# Patient Record
Sex: Male | Born: 1960 | Race: White | Hispanic: No | Marital: Married | Smoking: Former smoker
Health system: Southern US, Community
[De-identification: ages and names within clinical notes are randomized; demographics above are authoritative.]

## PROBLEM LIST (undated history)

## (undated) DIAGNOSIS — M869 Osteomyelitis, unspecified: Secondary | ICD-10-CM

## (undated) HISTORY — PX: TOE DEBRIDEMENT: SHX1069

## (undated) HISTORY — DX: Osteomyelitis, unspecified: M86.9

## (undated) HISTORY — PX: COLONOSCOPY: SHX174

---

## 2019-04-12 ENCOUNTER — Other Ambulatory Visit: Payer: Self-pay | Admitting: Otolaryngology

## 2019-04-12 DIAGNOSIS — J349 Unspecified disorder of nose and nasal sinuses: Secondary | ICD-10-CM

## 2019-04-14 ENCOUNTER — Other Ambulatory Visit: Payer: Self-pay | Admitting: Otolaryngology

## 2019-04-17 ENCOUNTER — Other Ambulatory Visit: Payer: Self-pay | Admitting: Otolaryngology

## 2019-04-18 ENCOUNTER — Other Ambulatory Visit: Payer: Self-pay

## 2019-04-27 ENCOUNTER — Ambulatory Visit
Admission: RE | Admit: 2019-04-27 | Discharge: 2019-04-27 | Disposition: A | Discharge status: Home / Self Care | Payer: BLUE CROSS/BLUE SHIELD | Source: Ambulatory Visit | Attending: Otolaryngology | Admitting: Otolaryngology

## 2019-04-27 DIAGNOSIS — J349 Unspecified disorder of nose and nasal sinuses: Secondary | ICD-10-CM

## 2019-05-09 ENCOUNTER — Telehealth: Payer: Self-pay | Admitting: *Deleted

## 2019-05-09 NOTE — Telephone Encounter (Signed)
CT ordered, completed 5/21. Per Dr Jacinto Reap, had a conversation with Dr Johnnye Sima and patient was supposed to be referred to [facility] for chronic nasal bone infection. Justin Nelson wife [phone], [phone]

## 2019-05-10 ENCOUNTER — Other Ambulatory Visit: Payer: Self-pay

## 2019-05-10 ENCOUNTER — Ambulatory Visit
Admission: RE | Admit: 2019-05-10 | Discharge: 2019-05-10 | Disposition: A | Discharge status: Home / Self Care | Payer: BC Managed Care – PPO | Source: Ambulatory Visit | Attending: Infectious Disease | Admitting: Infectious Disease

## 2019-05-10 ENCOUNTER — Ambulatory Visit: Payer: BC Managed Care – PPO | Admitting: Infectious Disease

## 2019-05-10 ENCOUNTER — Encounter: Payer: Self-pay | Admitting: Infectious Disease

## 2019-05-10 VITALS — BP 147/84 | HR 77 | Temp 97.9°F | Wt 168.0 lb

## 2019-05-10 DIAGNOSIS — M8668 Other chronic osteomyelitis, other site: Secondary | ICD-10-CM

## 2019-05-10 DIAGNOSIS — J320 Chronic maxillary sinusitis: Secondary | ICD-10-CM | POA: Diagnosis not present

## 2019-05-10 DIAGNOSIS — J329 Chronic sinusitis, unspecified: Secondary | ICD-10-CM

## 2019-05-10 DIAGNOSIS — Z87891 Personal history of nicotine dependence: Secondary | ICD-10-CM | POA: Diagnosis not present

## 2019-05-10 DIAGNOSIS — M869 Osteomyelitis, unspecified: Secondary | ICD-10-CM

## 2019-05-10 HISTORY — DX: Chronic sinusitis, unspecified: J32.9

## 2019-05-10 HISTORY — DX: Personal history of nicotine dependence: Z87.891

## 2019-05-10 HISTORY — DX: Other chronic osteomyelitis, other site: M86.68

## 2019-05-10 NOTE — Telephone Encounter (Signed)
I believe Dr Tommy Medal saw him this AM thanks

## 2019-05-10 NOTE — Progress Notes (Signed)
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 Lucia Gaskins   Patient ID: Justin Nelson, male    DOB: 1961-10-17, 58 y.o.   MRN: [id number]  HPI   Justin Nelson is a 58 year old Caucasian male who is largely been healthy.  He did smoke for most of his life until he quit this last spring.  He also did have an episode of osteomyelitis of 1 of his digits of his toe more than 20 years ago which was treated with IV antibiotics.  He says that he has typically sinus infections that do not require antibiotics and if he states to me that he has only taken antibiotics within the last year otherwise not had antibiotics much since he had that episode of osteomyelitis 20 years ago.  Apparently in August 2019 he was suffering from nosebleeds and had a CT of his maxillofacial sinuses done at that time.  Then this February he developed influenza which was proven positive by a swab.  During that time.  He was irrigating his sinuses with distilled water and salt compounds which he has done previously to help treat sinus irritation and infection.  In the weeks that followed he developed a swelling on the bridge of his nose that is progressed since then.  He has been seen by Dr. Lucia Gaskins with ear nose and throat.  Initially there was thought that the patient had a nasal fracture and a deviated septum but there was never any history of trauma whatsoever.  In the ensuing months it is continued to grow.  He has been given antibiotics in the form of azithromycin then amoxicillin and more recently cefdinir along with a steroid pack  He had a CT scan performed which now suggests nasal bone osteomyelitis.  The radiologist were suggesting the possibility of an atypical organism such as a fungal organism or nontuberculous or tuberculosis mycobacterium.  In talking the patient he has lived his entire life in [place] other than a  short period of time in [place].  He is only travel to [place] once when he visited [place] and [place] in [place] which was roughly 8 years ago.  Other than that he has no foreign travel.  He has no known history of exposure to anyone with tuberculosis.  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  He has a [place].  He does not have any other pets and has had no exposure to plant animal farm animals animals being birth.  He does no history of hunting animals.  He has no history of being on steroids other than the recent prednisone taper.  He does not recall trauma to his nose ever.  He was referred for [place] for further evaluation and treatment.  I think clearly he needs a biopsy of the affected area significantly sent for bacterial cultures, AFB and fungal cultures.  I do think an organism such as nocardia could be in play here given the smoldering nature of this infection.  It would typically grow in the AFB media but could also be placed on buffered charcoal yeast extract for culture   Past Medical History:  Diagnosis Date  . 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 Chronic sinusitis 05/10/2019  . Former smoker 05/10/2019  . Osteomyelitis of toe ([place])     History reviewed. No pertinent surgical history.  History reviewed. No pertinent family history.    Social History   Socioeconomic History  . Marital status: Married    Spouse name: Not on file  . Number of children: Not on file  . Years of education: Not on file  . Highest education level: Not on file  Occupational History  . Not on file  Social Needs  . Financial resource strain: Not on file  . Food insecurity:    Worry: Not on file    Inability: Not on file  . Transportation needs:    Medical: Not on file     Non-medical: Not on file  Tobacco Use  . Smoking status: Former Research scientist (life sciences)  . Smokeless tobacco: Current User    Types: Snuff  Substance and Sexual Activity  . Alcohol use: Not on file  . Drug use: Not on file  . Sexual activity: Not on file  Lifestyle  . Physical activity:    Days per week: Not on file    Minutes per session: Not on file  . Stress: Not on file  Relationships  . Social connections:    Talks on phone: Not on file    Gets together: Not on file    Attends religious service: Not on file    Active member of club or organization: Not on file    Attends meetings of clubs or organizations: Not on file    Relationship status: Not on file  Other Topics Concern  . Not on file  Social History Narrative  . Not on file    Allergies  Allergen Reactions  . Codeine     Sweaty, pass out felling     Current Outpatient Medications:  .  cefUROXime (CEFTIN) 250 MG tablet, , Disp: , Rfl:  .  azithromycin (ZITHROMAX) 250 MG tablet, TAKE 2 TABLETS BY MOUTH TODAY, THEN TAKE 1 TABLET DAILY FOR 4 DAYS, Disp: , Rfl:  .  brompheniramine-pseudoephedrine-DM 30-2-10 MG/5ML syrup, TAKE 10ML PO TID PRF COUGH AND CONGESTION, Disp: , Rfl:  .  oseltamivir (TAMIFLU) 75 MG capsule, Take 75 mg by mouth 2 (two) times daily., Disp: , Rfl:  .  predniSONE (STERAPRED UNI-PAK 21 TAB) 10 MG (21) TBPK tablet, , Disp: , Rfl:  .  predniSONE (STERAPRED UNI-PAK 21 TAB) 10 MG (21) TBPK tablet, See admin instructions., Disp: , Rfl:  .  silver sulfADIAZINE (SILVADENE) 1 % cream, APPLY ONCE DAILY FOR 15 DAYS, Disp: , Rfl:     Review of Systems  Constitutional: Negative for chills and fever.  HENT: Positive for nosebleeds and sinus pain. Negative for congestion and sore throat.   Eyes: Negative for photophobia.  Respiratory: Negative for cough, shortness of breath and wheezing.   Cardiovascular: Negative for chest pain, palpitations and leg swelling.  Gastrointestinal: Negative for abdominal pain, blood in  stool, constipation, diarrhea, nausea and vomiting.  Genitourinary: Negative for dysuria, flank pain and hematuria.  Musculoskeletal: Negative for back pain and myalgias.  Skin: Negative for rash.  Neurological: Negative for dizziness, weakness and headaches.  Hematological: Does not bruise/bleed easily.  Psychiatric/Behavioral: Negative for suicidal ideas.       Objective:   Physical Exam Constitutional:      General: He is not in acute distress.    Appearance: Normal appearance. He is well-developed. He is not ill-appearing or diaphoretic.  HENT:     Head: Normocephalic and atraumatic.      Right Ear: Hearing and external ear normal.     Left Ear: Hearing and external ear normal.     Nose: No nasal deformity or rhinorrhea.  Eyes:     General: No scleral icterus.    Extraocular Movements: Extraocular movements intact.     Conjunctiva/sclera: Conjunctivae normal.     Right eye: Right conjunctiva is not injected.     Left eye: Left conjunctiva is not injected.  Neck:     Musculoskeletal: Normal range of motion and neck supple.     Vascular: No JVD.  Cardiovascular:     Rate and Rhythm: Normal rate and regular rhythm.     Heart sounds: Normal heart sounds, S1 normal and S2 normal. No murmur. No friction rub.  Pulmonary:     Effort: Pulmonary effort is normal. No respiratory distress.     Breath sounds: Normal breath sounds. No stridor. No rhonchi or rales.  Chest:     Chest wall: No tenderness.  Abdominal:     General: Bowel sounds are normal. There is no distension.     Palpations: Abdomen is soft.     Tenderness: There is no abdominal tenderness.  Musculoskeletal: Normal range of motion.     Right shoulder: Normal.     Left shoulder: Normal.     Right hip: Normal.     Left hip: Normal.     Right knee: Normal.     Left knee: Normal.  Lymphadenopathy:     Head:     Right side of head: No submandibular, preauricular or posterior auricular adenopathy.     Left side of  head: No submandibular, preauricular or posterior auricular adenopathy.     Cervical: No cervical adenopathy.     Right cervical: No superficial or deep cervical adenopathy.    Left cervical: No superficial or deep cervical adenopathy.  Skin:    General: Skin is warm and dry.     Coloration: Skin is not pale.     Findings: No abrasion, bruising, ecchymosis, erythema, lesion or rash.     Nails: There is no clubbing.   Neurological:     General: No focal deficit present.     Mental Status: He is alert and oriented to person, place, and time.     Sensory: No sensory deficit.     Coordination: Coordination normal.     Gait: Gait normal.  Psychiatric:        Attention and Perception: He is attentive.        Mood and Affect: Mood normal.        Speech: Speech normal.        Behavior: Behavior normal. Behavior is cooperative.        Thought Content: Thought content normal.        Judgment: Judgment normal.    May 10, 2019: Nose is swollen at the bridge           Assessment & Plan:  Nasal sinus osteomyelitis:  As above I would like him to undergo biopsy of this area so that a sample of the bone can be sent for bacterial cultures AFB cultures fungal cultures and cultures for nocardia.  Can also try to send a sample for PCR testing smears to wash in [place].  If we have a specific organism then I will offer him targeted to systemic IV antibiotics for 6 to 8 weeks.  In the  interim I will get a CMP CBC sed rate C-reactive protein.  I will check him for HIV and hepatitis C.  I will check a QuantiFERON gold I am skeptical that he would have tuberculosis and I will check a chest x-ray I also will check an angiotensin-converting enzyme as well as S and ANCA  I put in a call to Dr. Pollie Friar office and left my cell phone number with him so I could talk to him about how we could arrange biopsy and review the case  I spent greater than 40 minutes with the patient including greater than 50%  of time in face to face counsel of the patient regarding the work-up of his possible nasal osteomyelitis however need to get a biopsy and how to pursue targeted IV antibiotics if possible and in coordination of his care.

## 2019-05-12 ENCOUNTER — Ambulatory Visit: Payer: Self-pay | Admitting: Otolaryngology

## 2019-05-12 LAB — CBC WITH DIFFERENTIAL/PLATELET
Absolute Monocytes: 635 cells/uL (ref 200–950)
Basophils Absolute: 62 cells/uL (ref 0–200)
Basophils Relative: 0.9 %
Eosinophils Absolute: 131 cells/uL (ref 15–500)
Eosinophils Relative: 1.9 %
HCT: 44.7 % (ref 38.5–50.0)
Hemoglobin: 15.1 g/dL (ref 13.2–17.1)
Lymphs Abs: 2118 cells/uL (ref 850–3900)
MCH: 32.1 pg (ref 27.0–33.0)
MCHC: 33.8 g/dL (ref 32.0–36.0)
MCV: 95.1 fL (ref 80.0–100.0)
MPV: 11.9 fL (ref 7.5–12.5)
Monocytes Relative: 9.2 %
Neutro Abs: 3954 cells/uL (ref 1500–7800)
Neutrophils Relative %: 57.3 %
Platelets: 216 10*3/uL (ref 140–400)
RBC: 4.7 10*6/uL (ref 4.20–5.80)
RDW: 11.9 % (ref 11.0–15.0)
Total Lymphocyte: 30.7 %
WBC: 6.9 10*3/uL (ref 3.8–10.8)

## 2019-05-12 LAB — ANGIOTENSIN CONVERTING ENZYME: Angiotensin-Converting Enzyme: 52 U/L (ref 9–67)

## 2019-05-12 LAB — QUANTIFERON-TB GOLD PLUS
Mitogen-NIL: >10 IU/mL
NIL: 0.01 IU/mL
QuantiFERON-TB Gold Plus: NEGATIVE
TB1-NIL: 0 IU/mL
TB2-NIL: 0 IU/mL

## 2019-05-12 LAB — ANCA SCREEN W REFLEX TITER: ANCA Screen: NEGATIVE

## 2019-05-12 LAB — COMPLETE METABOLIC PANEL WITH GFR
AG Ratio: 1.6 (calc) (ref 1.0–2.5)
ALT: 20 U/L (ref 9–46)
AST: 19 U/L (ref 10–35)
Albumin: 4.6 g/dL (ref 3.6–5.1)
Alkaline phosphatase (APISO): 37 U/L (ref 35–144)
BUN: 16 mg/dL (ref 7–25)
CO2: 30 mmol/L (ref 20–32)
Calcium: 10.1 mg/dL (ref 8.6–10.3)
Chloride: 101 mmol/L (ref 98–110)
Creat: 1.04 mg/dL (ref 0.70–1.33)
GFR, Est African American: 93 mL/min/{1.73_m2} (ref >=60)
GFR, Est Non African American: 80 mL/min/{1.73_m2} (ref >=60)
Globulin: 2.8 g/dL (calc) (ref 1.9–3.7)
Glucose, Bld: 105 mg/dL — ABNORMAL HIGH (ref 65–99)
Potassium: 4.8 mmol/L (ref 3.5–5.3)
Sodium: 138 mmol/L (ref 135–146)
Total Bilirubin: 1.1 mg/dL (ref 0.2–1.2)
Total Protein: 7.4 g/dL (ref 6.1–8.1)

## 2019-05-12 LAB — C-REACTIVE PROTEIN: CRP: 0.8 mg/L (ref <8.0)

## 2019-05-12 LAB — SEDIMENTATION RATE: Sed Rate: 2 mm/h (ref 0–20)

## 2019-05-12 LAB — CRYPTOCOCCAL AG, LTX SCR RFLX TITER
Cryptococcal Ag Screen: NOT DETECTED
MICRO NUMBER:: 535855
SPECIMEN QUALITY:: ADEQUATE

## 2019-05-12 LAB — HIV ANTIBODY (ROUTINE TESTING W REFLEX): HIV 1&2 Ab, 4th Generation: NONREACTIVE

## 2019-05-12 LAB — HEPATITIS C ANTIBODY
Hepatitis C Ab: NONREACTIVE
SIGNAL TO CUT-OFF: 0.01 (ref <1.00)

## 2019-05-12 NOTE — H&P (Signed)
PREOPERATIVE H&P  Chief Complaint: chronic nasal infection with osteomyelitis of the nasal bones and septum  HPI: Justin Nelson is a 58 y.o. male who presents for evaluation of chronic nasal infection. This initially began back in February where he developed tenderness over the nasal bones and swelling. He recently had a CT scan of the sinuses that did not demonstrate any significant sinus disease but demonstrate changes of the nasal bone and septum consistent with osteomyelitis. He also has some thickening of the tissue over the left nasal bone. He is taken to the operating room this time for biopsy and cultures.  Past Medical History:  Diagnosis Date  . 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 Chronic sinusitis 05/10/2019  . Former smoker 05/10/2019  . Osteomyelitis of toe ([place])    No past surgical history on file. Social History   Socioeconomic History  . Marital status: Married    Spouse name: Not on file  . Number of children: Not on file  . Years of education: Not on file  . Highest education level: Not on file  Occupational History  . Not on file  Social Needs  . Financial resource strain: Not on file  . Food insecurity:    Worry: Not on file    Inability: Not on file  . Transportation needs:    Medical: Not on file    Non-medical: Not on file  Tobacco Use  . Smoking status: Former Research scientist (life sciences)  . Smokeless tobacco: Current User    Types: Snuff  Substance and Sexual Activity  . Alcohol use: Not on file  . Drug use: Not on file  . Sexual activity: Not on file  Lifestyle  . Physical activity:    Days per week: Not on file    Minutes per session: Not on file  . Stress: Not on file  Relationships  . Social connections:    Talks on phone: Not on file    Gets together: Not on file    Attends religious service: Not on file    Active member of club or organization: Not on file    Attends meetings of clubs or organizations: Not on file    Relationship  status: Not on file  Other Topics Concern  . Not on file  Social History Narrative  . Not on file   No family history on file. Allergies  Allergen Reactions  . Codeine     Sweaty, pass out felling   Prior to Admission medications   Medication Sig Start Date End Date Taking? Authorizing Provider  azithromycin (ZITHROMAX) 250 MG tablet TAKE 2 TABLETS BY MOUTH TODAY, THEN TAKE 1 TABLET DAILY FOR 4 DAYS 01/21/19   [provider]  brompheniramine-pseudoephedrine-DM 30-2-10 MG/5ML syrup TAKE 10ML PO TID PRF COUGH AND CONGESTION 01/24/19   [provider]  cefUROXime (CEFTIN) 250 MG tablet  05/02/19   [provider]  oseltamivir (TAMIFLU) 75 MG capsule Take 75 mg by mouth 2 (two) times daily. 01/21/19   [provider]  predniSONE (STERAPRED UNI-PAK 21 TAB) 10 MG (21) TBPK tablet  05/02/19   [provider]  predniSONE (STERAPRED UNI-PAK 21 TAB) 10 MG (21) TBPK tablet See admin instructions. 05/02/19   [provider]  silver sulfADIAZINE (SILVADENE) 1 % cream APPLY ONCE DAILY FOR 15 DAYS 01/13/19   [provider]     Positive ROS: chronic tenderness of the nasal bone  All other systems have been reviewed and were otherwise negative  with the exception of those mentioned in the HPI and as above.  Physical Exam: There were no vitals filed for this visit.  General: Alert, no acute distress Oral: Normal oral mucosa and tonsils Nasal: Clear nasal passages. No purulence no foreign body noted. He has swelling and erythema more on the left side the nasal bone. Neck: No palpable adenopathy or thyroid nodules Ear: Ear canal is clear with normal appearing TMs Cardiovascular: Regular rate and rhythm, no murmur.  Respiratory: Clear to auscultation Neurologic: Alert and oriented x 3   Assessment/Plan: CHRONIC RHINITIS AND OSTEOMYELITIS Plan for Procedure(s): NASAL ENDOSCOPY AND BIOPSY   Melony Overly, MD 05/12/2019 4:34 PM

## 2019-05-15 ENCOUNTER — Encounter (HOSPITAL_BASED_OUTPATIENT_CLINIC_OR_DEPARTMENT_OTHER): Payer: Self-pay | Admitting: *Deleted

## 2019-05-15 ENCOUNTER — Other Ambulatory Visit: Payer: Self-pay

## 2019-05-15 LAB — HISTOPLASMA ANTIGEN, URINE: Histoplasma Antigen, urine: <0.2

## 2019-05-16 ENCOUNTER — Other Ambulatory Visit (HOSPITAL_COMMUNITY)
Admission: RE | Admit: 2019-05-16 | Discharge: 2019-05-16 | Disposition: A | Discharge status: Home / Self Care | Payer: BC Managed Care – PPO | Source: Ambulatory Visit | Attending: Otolaryngology | Admitting: Otolaryngology

## 2019-05-16 DIAGNOSIS — Z1159 Encounter for screening for other viral diseases: Secondary | ICD-10-CM | POA: Insufficient documentation

## 2019-05-16 DIAGNOSIS — Z01812 Encounter for preprocedural laboratory examination: Secondary | ICD-10-CM | POA: Insufficient documentation

## 2019-05-17 LAB — NOVEL CORONAVIRUS, NAA (HOSP ORDER, SEND-OUT TO REF LAB; TAT 18-24 HRS): SARS-CoV-2, NAA: NOT DETECTED

## 2019-05-19 ENCOUNTER — Ambulatory Visit (HOSPITAL_BASED_OUTPATIENT_CLINIC_OR_DEPARTMENT_OTHER)
Admission: RE | Admit: 2019-05-19 | Discharge: 2019-05-19 | Disposition: A | Discharge status: Home / Self Care | Payer: BC Managed Care – PPO | Attending: Otolaryngology | Admitting: Otolaryngology

## 2019-05-19 ENCOUNTER — Other Ambulatory Visit: Payer: Self-pay

## 2019-05-19 ENCOUNTER — Encounter (HOSPITAL_BASED_OUTPATIENT_CLINIC_OR_DEPARTMENT_OTHER)
Admission: RE | Disposition: A | Discharge status: Home / Self Care | Payer: Self-pay | Source: Home / Self Care | Attending: Otolaryngology

## 2019-05-19 ENCOUNTER — Ambulatory Visit (HOSPITAL_BASED_OUTPATIENT_CLINIC_OR_DEPARTMENT_OTHER): Payer: BC Managed Care – PPO | Admitting: Anesthesiology

## 2019-05-19 ENCOUNTER — Encounter (HOSPITAL_BASED_OUTPATIENT_CLINIC_OR_DEPARTMENT_OTHER): Payer: Self-pay | Admitting: *Deleted

## 2019-05-19 DIAGNOSIS — Z885 Allergy status to narcotic agent status: Secondary | ICD-10-CM | POA: Diagnosis not present

## 2019-05-19 DIAGNOSIS — M869 Osteomyelitis, unspecified: Secondary | ICD-10-CM | POA: Insufficient documentation

## 2019-05-19 DIAGNOSIS — J310 Chronic rhinitis: Secondary | ICD-10-CM | POA: Diagnosis present

## 2019-05-19 DIAGNOSIS — C410 Malignant neoplasm of bones of skull and face: Secondary | ICD-10-CM | POA: Diagnosis not present

## 2019-05-19 DIAGNOSIS — Z87891 Personal history of nicotine dependence: Secondary | ICD-10-CM | POA: Diagnosis not present

## 2019-05-19 HISTORY — PX: NASAL ENDOSCOPY: SHX6577

## 2019-05-19 SURGERY — ENDOSCOPY, NOSE
Anesthesia: General | Site: Nose

## 2019-05-19 MED ORDER — PHENYLEPHRINE 40 MCG/ML (10ML) SYRINGE FOR IV PUSH (FOR BLOOD PRESSURE SUPPORT)
PREFILLED_SYRINGE | INTRAVENOUS | Status: AC
  Filled 2019-05-19: qty 10

## 2019-05-19 MED ORDER — ONDANSETRON HCL 4 MG/2ML IJ SOLN
INTRAMUSCULAR | Status: AC
  Filled 2019-05-19: qty 2

## 2019-05-19 MED ORDER — CHLORHEXIDINE GLUCONATE CLOTH 2 % EX PADS: 6.0000 | MEDICATED_PAD | Freq: Once | CUTANEOUS | Status: DC

## 2019-05-19 MED ORDER — DEXAMETHASONE SODIUM PHOSPHATE 10 MG/ML IJ SOLN
INTRAMUSCULAR | Status: AC
  Filled 2019-05-19: qty 1

## 2019-05-19 MED ORDER — MEPERIDINE HCL 25 MG/ML IJ SOLN: 6.2500 mg | INTRAMUSCULAR | Status: DC | PRN

## 2019-05-19 MED ORDER — SUGAMMADEX SODIUM 500 MG/5ML IV SOLN
INTRAVENOUS | Status: AC
  Filled 2019-05-19: qty 5

## 2019-05-19 MED ORDER — SUGAMMADEX SODIUM 200 MG/2ML IV SOLN
INTRAVENOUS | Status: DC | PRN
  Administered 2019-05-19: 200 mg via INTRAVENOUS

## 2019-05-19 MED ORDER — PROPOFOL 10 MG/ML IV BOLUS
INTRAVENOUS | Status: DC | PRN
  Administered 2019-05-19: 150 mg via INTRAVENOUS

## 2019-05-19 MED ORDER — LIDOCAINE-EPINEPHRINE 1 %-1:100000 IJ SOLN
INTRAMUSCULAR | Status: AC
  Filled 2019-05-19: qty 1

## 2019-05-19 MED ORDER — MUPIROCIN 2 % EX OINT
TOPICAL_OINTMENT | CUTANEOUS | Status: AC
  Filled 2019-05-19: qty 22

## 2019-05-19 MED ORDER — FENTANYL CITRATE (PF) 100 MCG/2ML IJ SOLN
INTRAMUSCULAR | Status: AC
  Filled 2019-05-19: qty 2

## 2019-05-19 MED ORDER — LIDOCAINE 2% (20 MG/ML) 5 ML SYRINGE
INTRAMUSCULAR | Status: AC
  Filled 2019-05-19: qty 5

## 2019-05-19 MED ORDER — CEFUROXIME AXETIL 250 MG PO TABS: 250.0000 mg | ORAL_TABLET | Freq: Two times a day (BID) | ORAL | 0 refills | Status: AC

## 2019-05-19 MED ORDER — HYDROMORPHONE HCL 1 MG/ML IJ SOLN: 0.2500 mg | INTRAMUSCULAR | Status: DC | PRN

## 2019-05-19 MED ORDER — CEFAZOLIN SODIUM-DEXTROSE 2-4 GM/100ML-% IV SOLN
INTRAVENOUS | Status: AC
  Filled 2019-05-19: qty 100

## 2019-05-19 MED ORDER — LIDOCAINE HCL (CARDIAC) PF 100 MG/5ML IV SOSY
PREFILLED_SYRINGE | INTRAVENOUS | Status: DC | PRN
  Administered 2019-05-19: 50 mg via INTRAVENOUS

## 2019-05-19 MED ORDER — ACETAMINOPHEN 500 MG PO TABS
ORAL_TABLET | ORAL | Status: AC
  Filled 2019-05-19: qty 2

## 2019-05-19 MED ORDER — MIDAZOLAM HCL 2 MG/2ML IJ SOLN: 0.5000 mg | Freq: Once | INTRAMUSCULAR | Status: DC | PRN

## 2019-05-19 MED ORDER — CEFAZOLIN SODIUM-DEXTROSE 2-3 GM-%(50ML) IV SOLR
INTRAVENOUS | Status: DC | PRN
  Administered 2019-05-19: 2 g via INTRAVENOUS

## 2019-05-19 MED ORDER — OXYMETAZOLINE HCL 0.05 % NA SOLN
NASAL | Status: AC
  Filled 2019-05-19: qty 30

## 2019-05-19 MED ORDER — SCOPOLAMINE 1 MG/3DAYS TD PT72: 1.0000 | MEDICATED_PATCH | Freq: Once | TRANSDERMAL | Status: DC | PRN

## 2019-05-19 MED ORDER — BACITRACIN ZINC 500 UNIT/GM EX OINT
TOPICAL_OINTMENT | CUTANEOUS | Status: AC
  Filled 2019-05-19: qty 28.35

## 2019-05-19 MED ORDER — FENTANYL CITRATE (PF) 100 MCG/2ML IJ SOLN
50.0000 ug | INTRAMUSCULAR | Status: AC | PRN
  Administered 2019-05-19 (×2): 50 ug via INTRAVENOUS
  Administered 2019-05-19: 11:00:00 100 ug via INTRAVENOUS

## 2019-05-19 MED ORDER — DEXAMETHASONE SODIUM PHOSPHATE 4 MG/ML IJ SOLN
INTRAMUSCULAR | Status: DC | PRN
  Administered 2019-05-19: 10 mg via INTRAVENOUS

## 2019-05-19 MED ORDER — OXYMETAZOLINE HCL 0.05 % NA SOLN
NASAL | Status: DC | PRN
  Administered 2019-05-19: 1 via TOPICAL

## 2019-05-19 MED ORDER — MIDAZOLAM HCL 2 MG/2ML IJ SOLN
INTRAMUSCULAR | Status: AC
  Filled 2019-05-19: qty 2

## 2019-05-19 MED ORDER — LIDOCAINE-EPINEPHRINE 1 %-1:100000 IJ SOLN
INTRAMUSCULAR | Status: DC | PRN
  Administered 2019-05-19: 2 mL

## 2019-05-19 MED ORDER — EPHEDRINE 5 MG/ML INJ
INTRAVENOUS | Status: AC
  Filled 2019-05-19: qty 10

## 2019-05-19 MED ORDER — ACETAMINOPHEN 500 MG PO TABS
1000.0000 mg | ORAL_TABLET | Freq: Once | ORAL | Status: AC
  Administered 2019-05-19: 1000 mg via ORAL

## 2019-05-19 MED ORDER — ROCURONIUM BROMIDE 100 MG/10ML IV SOLN
INTRAVENOUS | Status: DC | PRN
  Administered 2019-05-19: 50 mg via INTRAVENOUS

## 2019-05-19 MED ORDER — PROMETHAZINE HCL 25 MG/ML IJ SOLN: 6.2500 mg | INTRAMUSCULAR | Status: DC | PRN

## 2019-05-19 MED ORDER — LACTATED RINGERS IV SOLN
INTRAVENOUS | Status: DC
  Administered 2019-05-19 (×2): via INTRAVENOUS

## 2019-05-19 MED ORDER — MIDAZOLAM HCL 2 MG/2ML IJ SOLN
1.0000 mg | INTRAMUSCULAR | Status: DC | PRN
  Administered 2019-05-19: 2 mg via INTRAVENOUS

## 2019-05-19 MED ORDER — SUCCINYLCHOLINE CHLORIDE 200 MG/10ML IV SOSY
PREFILLED_SYRINGE | INTRAVENOUS | Status: AC
  Filled 2019-05-19: qty 10

## 2019-05-19 MED ORDER — ROCURONIUM BROMIDE 10 MG/ML (PF) SYRINGE
PREFILLED_SYRINGE | INTRAVENOUS | Status: AC
  Filled 2019-05-19: qty 10

## 2019-05-19 SURGICAL SUPPLY — 57 items
BLADE RAD40 ROTATE 4M 4 5PK (BLADE) IMPLANT
BLADE RAD40 ROTATE 4M 4MM 5PK (BLADE)
BLADE RAD60 ROTATE M4 4 5PK (BLADE) IMPLANT
BLADE RAD60 ROTATE M4 4MM 5PK (BLADE)
BLADE TRICUT ROTATE M4 4 5PK (BLADE) IMPLANT
BLADE TRICUT ROTATE M4 4MM 5PK (BLADE)
BUR HS RAD FRONTAL 3 (BURR) IMPLANT
BUR HS RAD FRONTAL 3MM (BURR)
CANISTER SUC SOCK COL 7IN (MISCELLANEOUS) IMPLANT
CANISTER SUCT 1200ML W/VALVE (MISCELLANEOUS) ×3 IMPLANT
COAGULATOR SUCT 8FR VV (MISCELLANEOUS) ×3 IMPLANT
CONT SPEC 4OZ CLIKSEAL STRL BL (MISCELLANEOUS) ×3 IMPLANT
COVER WAND RF STERILE (DRAPES) IMPLANT
DECANTER SPIKE VIAL GLASS SM (MISCELLANEOUS) IMPLANT
DEPRESSOR TONGUE BLADE STERILE (MISCELLANEOUS) ×3 IMPLANT
DRESSING NASAL KENNEDY 3.5X.9 (MISCELLANEOUS) IMPLANT
DRSG NASAL KENNEDY 3.5X.9 (MISCELLANEOUS)
DRSG NASAL KENNEDY LMNT 8CM (GAUZE/BANDAGES/DRESSINGS) IMPLANT
DRSG NASOPORE 8CM (GAUZE/BANDAGES/DRESSINGS) ×3 IMPLANT
DRSG TELFA 3X8 NADH (GAUZE/BANDAGES/DRESSINGS) IMPLANT
ELECT REM PT RETURN 9FT ADLT (ELECTROSURGICAL) ×3
ELECTRODE REM PT RTRN 9FT ADLT (ELECTROSURGICAL) ×1 IMPLANT
GLOVE BIO SURGEON STRL SZ 6.5 (GLOVE) ×2 IMPLANT
GLOVE BIO SURGEON STRL SZ7 (GLOVE) ×3 IMPLANT
GLOVE BIO SURGEONS STRL SZ 6.5 (GLOVE) ×1
GLOVE BIOGEL PI IND STRL 7.0 (GLOVE) ×2 IMPLANT
GLOVE BIOGEL PI IND STRL 7.5 (GLOVE) ×1 IMPLANT
GLOVE BIOGEL PI INDICATOR 7.0 (GLOVE) ×4
GLOVE BIOGEL PI INDICATOR 7.5 (GLOVE) ×2
GLOVE SS BIOGEL STRL SZ 7.5 (GLOVE) ×1 IMPLANT
GLOVE SUPERSENSE BIOGEL SZ 7.5 (GLOVE) ×2
GOWN STRL REUS W/ TWL LRG LVL3 (GOWN DISPOSABLE) IMPLANT
GOWN STRL REUS W/ TWL XL LVL3 (GOWN DISPOSABLE) ×3 IMPLANT
GOWN STRL REUS W/TWL LRG LVL3 (GOWN DISPOSABLE)
GOWN STRL REUS W/TWL XL LVL3 (GOWN DISPOSABLE) ×6
HEMOSTAT SURGICEL .5X2 ABSORB (HEMOSTASIS) IMPLANT
HEMOSTAT SURGICEL 2X14 (HEMOSTASIS) IMPLANT
IV NS 500ML (IV SOLUTION)
IV NS 500ML BAXH (IV SOLUTION) IMPLANT
NEEDLE PRECISIONGLIDE 27X1.5 (NEEDLE) ×3 IMPLANT
NEEDLE SPNL 25GX3.5 QUINCKE BL (NEEDLE) IMPLANT
NS IRRIG 1000ML POUR BTL (IV SOLUTION) ×3 IMPLANT
PACK BASIN DAY SURGERY FS (CUSTOM PROCEDURE TRAY) ×3 IMPLANT
PACK ENT DAY SURGERY (CUSTOM PROCEDURE TRAY) ×3 IMPLANT
PATTIES SURGICAL .5 X3 (DISPOSABLE) ×3 IMPLANT
SLEEVE SCD COMPRESS KNEE MED (MISCELLANEOUS) ×3 IMPLANT
SOLUTION ANTI FOG 6CC (MISCELLANEOUS) ×3 IMPLANT
SPONGE GAUZE 2X2 8PLY STER LF (GAUZE/BANDAGES/DRESSINGS) ×1
SPONGE GAUZE 2X2 8PLY STRL LF (GAUZE/BANDAGES/DRESSINGS) ×2 IMPLANT
SUT CHROMIC 4 0 PS 2 18 (SUTURE) IMPLANT
SUT SILK 2 0 PERMA HAND 18 BK (SUTURE) IMPLANT
SYR 3ML 18GX1 1/2 (SYRINGE) IMPLANT
TOWEL GREEN STERILE FF (TOWEL DISPOSABLE) ×6 IMPLANT
TRAY DSU PREP LF (CUSTOM PROCEDURE TRAY) ×3 IMPLANT
TUBE CONNECTING 20'X1/4 (TUBING)
TUBE CONNECTING 20X1/4 (TUBING) IMPLANT
YANKAUER SUCT BULB TIP NO VENT (SUCTIONS) ×3 IMPLANT

## 2019-05-19 NOTE — Op Note (Signed)
[name], Justin Nelson MEDICAL RECORD HM:[id number] ACCOUNT [id number] DATE OF BIRTH:November 13, 1961 FACILITY: [facility] LOCATION: [facility] PHYSICIAN:Zai Chmiel Lincoln Maxin, MD  OPERATIVE REPORT  DATE OF PROCEDURE:  05/19/2019  PREOPERATIVE DIAGNOSES:  Chronic nasal infection, questionable neoplasm.  POSTOPERATIVE DIAGNOSES:  Chronic nasal infection, questionable neoplasm.  OPERATION PERFORMED:  Nasal endoscopy with a biopsy from right nasal cavity.  SURGEON:  Melony Overly, MD  ANESTHESIA:  General endotracheal.  COMPLICATIONS:  None.  ESTIMATED BLOOD LOSS:  Minimal.  BRIEF CLINICAL NOTE:  The patient is a 58 year old gentleman who has had a chronic sore on the bridge of his nose that has gradually gotten larger over the last 3 or 4 months.  He had a CT scan that showed some changes with thinning of the nasal bones as  well as changes of the septal bone with very thickened tissue in the nasal cavity.  Sinuses were relatively clear.  He has had chronic soreness in this area for 2-3 months.  On clinical exam, this is questionable whether this represents a chronic  infection versus Wegener's granulomatosis versus tumor.  He is taken to the operating room at this time for endoscopic exam, biopsies and possible cultures.  DESCRIPTION OF PROCEDURE:  After adequate endotracheal anesthesia, the patient's nose was decongested with Afrin and then nasal endoscopy was performed.  He had a deviation of the septum to the left.  The left nasal mucosa was relatively clear with  minimal changes; however, on the right side, he had an ulcer on the mid superior portion of the septum with some granulation type appearing tissue around the edges of the ulcer.  A large piece of tissue was taken from the midportion of the right septum  and sent for frozen section.  While we were awaiting frozen section, several other biopsies were all obtained from the right nasal cavity including some cartilage and bone with some  surrounding tissue.  This was separated for pathology as well as for  possible cultures.  Hemostasis was obtained with Afrin packing and suction cautery.  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  Following this, the procedure was completed and the remaining tissue was all sent for pathology.   Hemostasis was obtained with suction cautery and a single Nasopore pack was placed in the right nasal cavity.  This completed the procedure.  The patient subsequently was awoken from anesthesia.  He received 2 g Ancef intraoperatively, and patient will  be discharged home later this morning.  He will follow up in my office in 1 week for recheck and review final pathology.  LN/NUANCE  D:05/19/2019 T:05/19/2019 JOB:[id number]

## 2019-05-19 NOTE — Discharge Instructions (Addendum)
Apply cool compress to nose to reduce bleeding and swelling Tylenol or ibuprofen prn pain ** You had 1000 mg of tylenol at 1230 Ceftin 500 mg bid for 1 week Return to see Dr Lucia Gaskins next Thursday at 4 pm   [place] Instructions  Activity: Get plenty of rest for the remainder of the day. A responsible individual must stay with you for 24 hours following the procedure.  For the next 24 hours, DO NOT: -Drive a car -Paediatric nurse -Drink alcoholic beverages -Take any medication unless instructed by your physician -Make any legal decisions or sign important papers.  Meals: Start with liquid foods such as gelatin or soup. Progress to regular foods as tolerated. Avoid greasy, spicy, heavy foods. If nausea and/or vomiting occur, drink only clear liquids until the nausea and/or vomiting subsides. Call your physician if vomiting continues.  Special Instructions/Symptoms: Your throat may feel dry or sore from the anesthesia or the breathing tube placed in your throat during surgery. If this causes discomfort, gargle with warm salt water. The discomfort should disappear within 24 hours.  If you had a scopolamine patch placed behind your ear for the management of post- operative nausea and/or vomiting:  1. The medication in the patch is effective for 72 hours, after which it should be removed.  Wrap patch in a tissue and discard in the trash. Wash hands thoroughly with soap and water. 2. You may remove the patch earlier than 72 hours if you experience unpleasant side effects which may include dry mouth, dizziness or visual disturbances. 3. Avoid touching the patch. Wash your hands with soap and water after contact with the patch.

## 2019-05-19 NOTE — Interval H&P Note (Signed)
History and Physical Interval Note:  05/19/2019 10:25 AM  Justin Nelson  has presented today for surgery, with the diagnosis of CHRONIC RHINITIS AND OSTEOMYELITIS.  The various methods of treatment have been discussed with the patient and family. After consideration of risks, benefits and other options for treatment, the patient has consented to  Procedure(s): NASAL ENDOSCOPY AND BIOPSY (N/A) as a surgical intervention.  The patient's history has been reviewed, patient examined, no change in status, stable for surgery.  I have reviewed the patient's chart and labs.  Questions were answered to the patient's satisfaction.     Melony Overly

## 2019-05-19 NOTE — Anesthesia Procedure Notes (Signed)
Procedure Name: Intubation Date/Time: 05/19/2019 10:37 AM Performed by: Willa Frater, CRNA Pre-anesthesia Checklist: Patient identified, Emergency Drugs available, Suction available and Patient being monitored Patient Re-evaluated:Patient Re-evaluated prior to induction Oxygen Delivery Method: Circle system utilized Preoxygenation: Pre-oxygenation with 100% oxygen Induction Type: IV induction Ventilation: Mask ventilation without difficulty Laryngoscope Size: Mac and 3 Grade View: Grade II Tube type: Oral Number of attempts: 1 Airway Equipment and Method: Stylet and Oral airway Placement Confirmation: ETT inserted through vocal cords under direct vision,  positive ETCO2 and breath sounds checked- equal and bilateral Secured at: 22 cm Tube secured with: Tape Dental Injury: Teeth and Oropharynx as per pre-operative assessment

## 2019-05-19 NOTE — Brief Op Note (Signed)
05/19/2019  11:49 AM  PATIENT:  Justin Nelson  58 y.o. male  PRE-OPERATIVE DIAGNOSIS:  CHRONIC RHINITIS AND OSTEOMYELITIS  POST-OPERATIVE DIAGNOSIS:  CHRONIC RHINITIS AND OSTEOMYELITIS  PROCEDURE:  Procedure(s): NASAL ENDOSCOPY AND BIOPSY (N/A)  SURGEON:  Surgeon(s) and Role:    Rozetta Nunnery, MD - Primary  PHYSICIAN ASSISTANT:   ASSISTANTS: none   ANESTHESIA:   general  EBL:  minimal  BLOOD ADMINISTERED:none  DRAINS: none   LOCAL MEDICATIONS USED:  NONE  SPECIMEN:  Source of Specimen:  right nasal cavity  DISPOSITION OF SPECIMEN:  PATHOLOGY  COUNTS:  YES  TOURNIQUET:  * No tourniquets in log *  DICTATION: .Other Dictation: Dictation Number [id number]  PLAN OF CARE: Discharge to home after PACU  PATIENT DISPOSITION:  PACU - hemodynamically stable.   Delay start of Pharmacological VTE agent (>24hrs) due to surgical blood loss or risk of bleeding: yes

## 2019-05-19 NOTE — Transfer of Care (Signed)
Immediate Anesthesia Transfer of Care Note  Patient: Lexicographer  Procedure(s) Performed: NASAL ENDOSCOPY AND BIOPSY (N/A Nose)  Patient Location: PACU  Anesthesia Type:General  Level of Consciousness: awake, alert  and oriented  Airway & Oxygen Therapy: Patient Spontanous Breathing and Patient connected to face mask oxygen  Post-op Assessment: Report given to RN and Post -op Vital signs reviewed and stable  Post vital signs: Reviewed and stable  Last Vitals:  Vitals Value Taken Time  BP 170/109 05/19/19 1148  Temp    Pulse 75 05/19/19 1149  Resp 11 05/19/19 1149  SpO2 99 % 05/19/19 1149  Vitals shown include unvalidated device data.  Last Pain:  Vitals:   05/19/19 0857  TempSrc: Oral  PainSc: 0-No pain         Complications: No apparent anesthesia complications

## 2019-05-19 NOTE — Anesthesia Postprocedure Evaluation (Signed)
Anesthesia Post Note  Patient: Lexicographer  Procedure(s) Performed: NASAL ENDOSCOPY AND BIOPSY (N/A Nose)     Patient location during evaluation: PACU Anesthesia Type: General Level of consciousness: awake and alert, patient cooperative and oriented Pain management: pain level controlled Vital Signs Assessment: post-procedure vital signs reviewed and stable Respiratory status: spontaneous breathing, nonlabored ventilation and respiratory function stable Cardiovascular status: blood pressure returned to baseline and stable Postop Assessment: no apparent nausea or vomiting and adequate PO intake Anesthetic complications: no    Last Vitals:  Vitals:   05/19/19 1307 05/19/19 1326  BP: (!) 162/95 (!) 158/90  Pulse: 72   Resp: 16   Temp: 37.1 C   SpO2: 100%     Last Pain:  Vitals:   05/19/19 1307  TempSrc: Oral  PainSc: 3                  Markeshia Giebel,E. Jacques Fife

## 2019-05-19 NOTE — Anesthesia Preprocedure Evaluation (Addendum)
Anesthesia Evaluation  Patient identified by MRN, date of birth, ID band Patient awake    Reviewed: Allergy & Precautions, NPO status , Patient's Chart, lab work & pertinent test results  History of Anesthesia Complications Negative for: history of anesthetic complications  Airway Mallampati: I  TM Distance: >3 FB Neck ROM: Full    Dental  (+) Dental Advisory Given   Pulmonary former smoker (quit Feb 2020),  05/16/2019 novel coronavirus NEG   breath sounds clear to auscultation       Cardiovascular negative cardio ROS   Rhythm:Regular Rate:Normal     Neuro/Psych negative neurological ROS     GI/Hepatic negative GI ROS, Neg liver ROS,   Endo/Other  negative endocrine ROS  Renal/GU negative Renal ROS     Musculoskeletal   Abdominal   Peds  Hematology negative hematology ROS (+)   Anesthesia Other Findings   Reproductive/Obstetrics                            Anesthesia Physical Anesthesia Plan  ASA: II  Anesthesia Plan: General   Post-op Pain Management:    Induction: Intravenous  PONV Risk Score and Plan: 2 and Ondansetron, Dexamethasone and Treatment may vary due to age or medical condition  Airway Management Planned: Oral ETT  Additional Equipment:   Intra-op Plan:   Post-operative Plan: Extubation in OR  Informed Consent: I have reviewed the patients History and Physical, chart, labs and discussed the procedure including the risks, benefits and alternatives for the proposed anesthesia with the patient or authorized representative who has indicated his/her understanding and acceptance.     Dental advisory given  Plan Discussed with: CRNA and Surgeon  Anesthesia Plan Comments:        Anesthesia Quick Evaluation

## 2019-05-22 ENCOUNTER — Encounter (HOSPITAL_BASED_OUTPATIENT_CLINIC_OR_DEPARTMENT_OTHER): Payer: Self-pay | Admitting: Otolaryngology

## 2019-05-22 NOTE — Addendum Note (Signed)
Addendum  created 05/22/19 1150 by Tawni Millers, CRNA   Charge Capture section accepted

## 2019-05-31 ENCOUNTER — Ambulatory Visit: Payer: BC Managed Care – PPO | Admitting: Infectious Disease

## 2019-10-01 IMAGING — CT CT MAXILLOFACIAL WITHOUT CONTRAST
3 of 5 series · 14 of 47 positions shown, 16 images · non-contrast
Comparison: None.
COMPARISON: None.

Addendum:
CLINICAL DATA: Fusion protocol. Chronic sinusitis. LEFT nasal bump.

EXAM:
CT MAXILLOFACIAL WITHOUT CONTRAST
TECHNIQUE: Multidetector CT imaging of the maxillofacial structures was
performed. Multiplanar CT image reconstructions were also generated.

[Series 4: sinus 2.00 hr60 s3 cor · coronal · 0.33mm/px · 3 of 118 slices shown]
[im 40/118  bone]
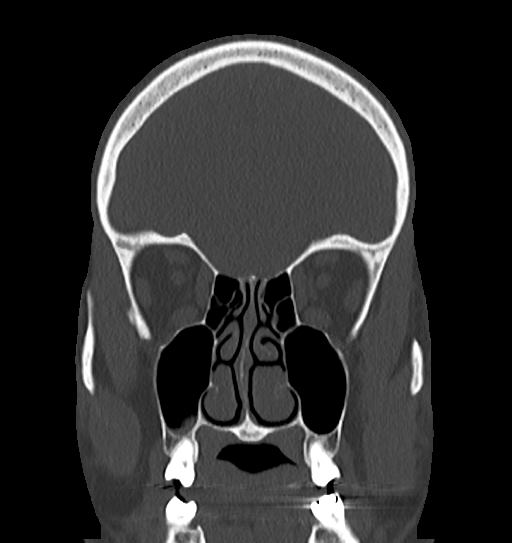
[im 53/118  bone]
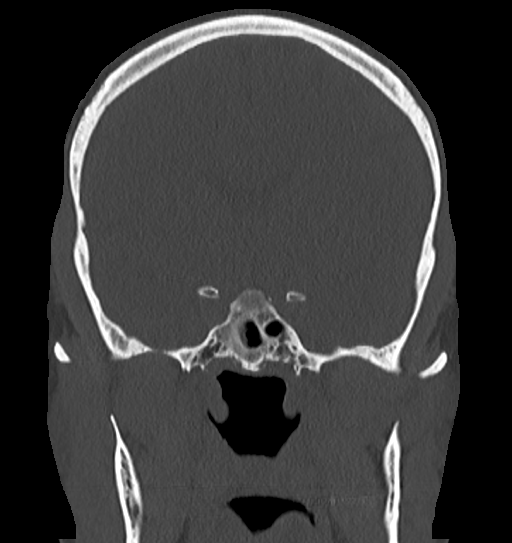
[im 66/118  bone]
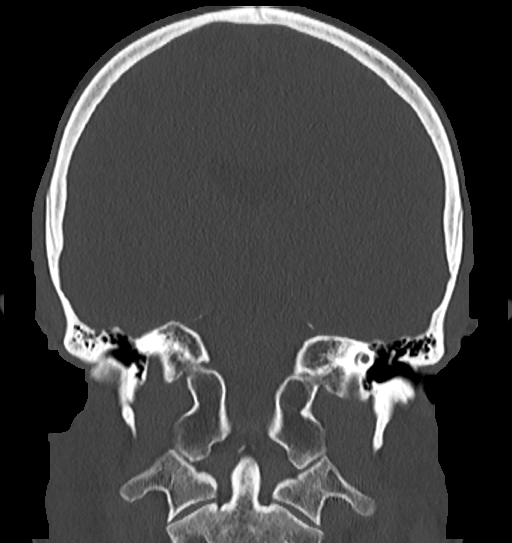

[Series 6: sinus 2.00 hr60 s3 sag · sagittal · 0.35mm/px · 3 of 83 slices shown]
[im 28/83  bone]
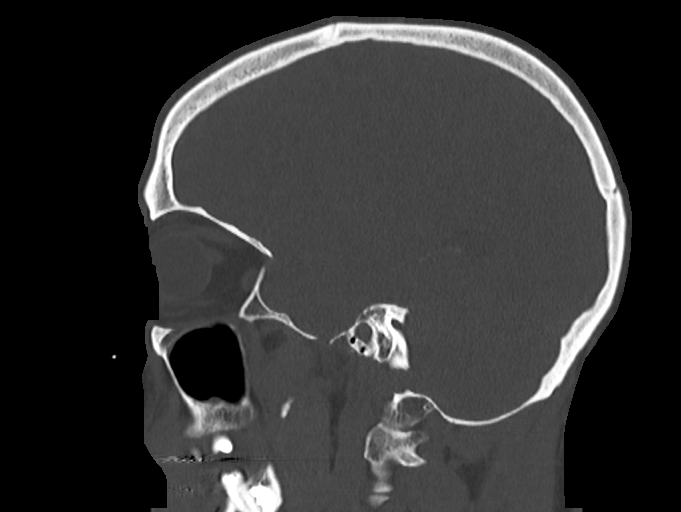
[im 42/83  bone]
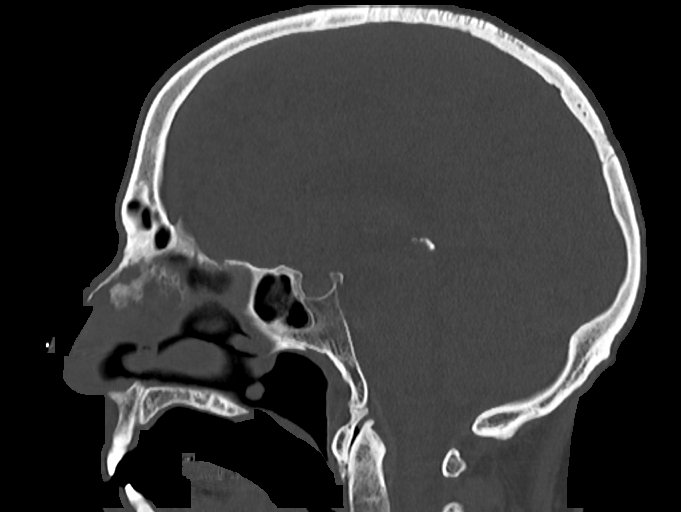
[im 55/83  bone]
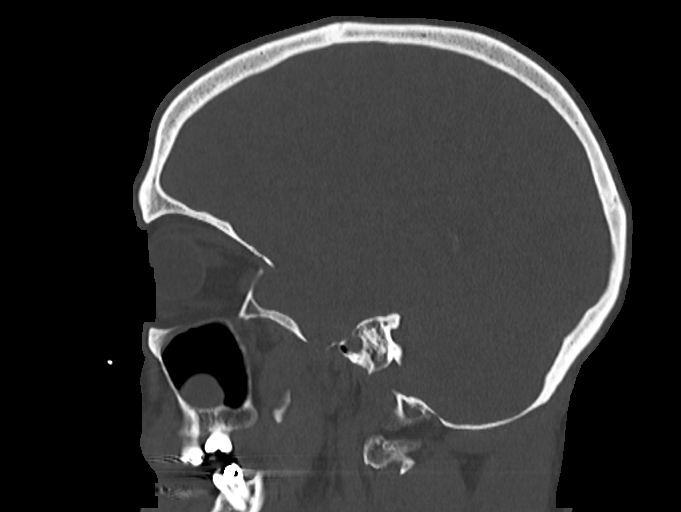

[Series 12: sinus 1.00 hr60 s3 fusion thins · axial · 0.46mm/px · z∈[-664,-529]mm · 8 of 291 slices shown, 10 images]
[im 33/291  brain]
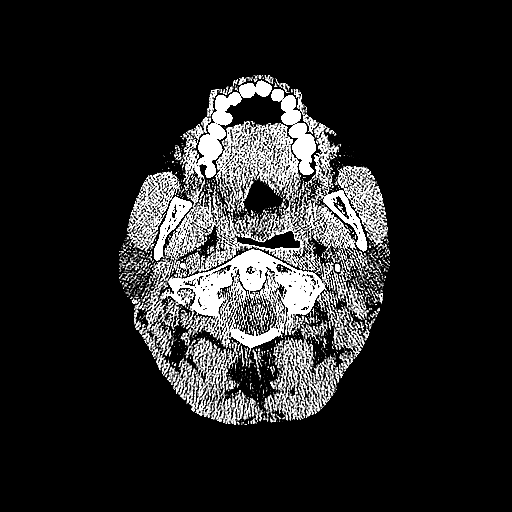
[im 33/291  bone]
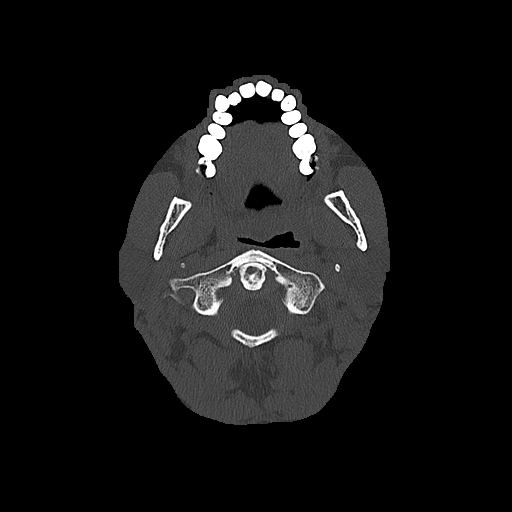
[im 65/291  bone]
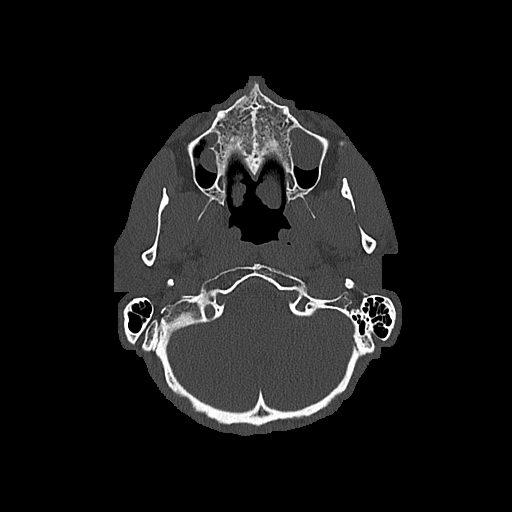
[im 97/291  bone]
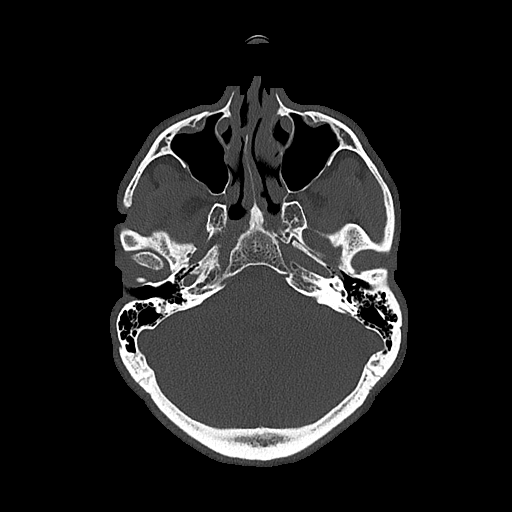
[im 129/291  bone]
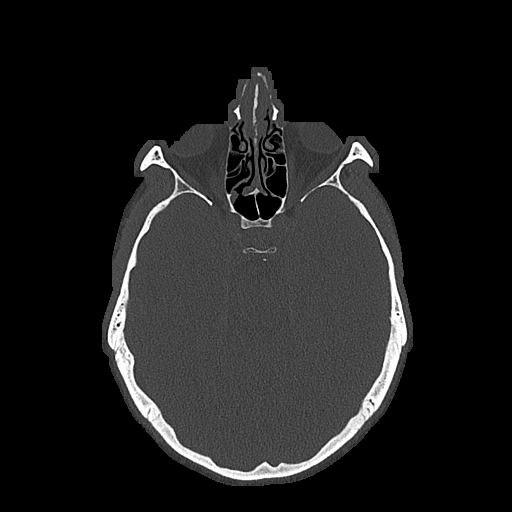
[im 162/291  brain]
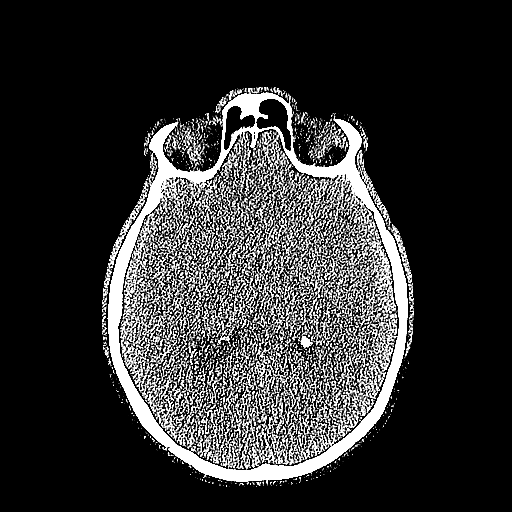
[im 162/291  bone]
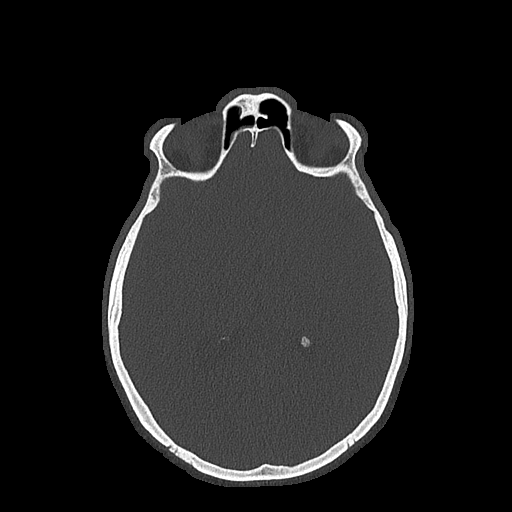
[im 194/291  bone]
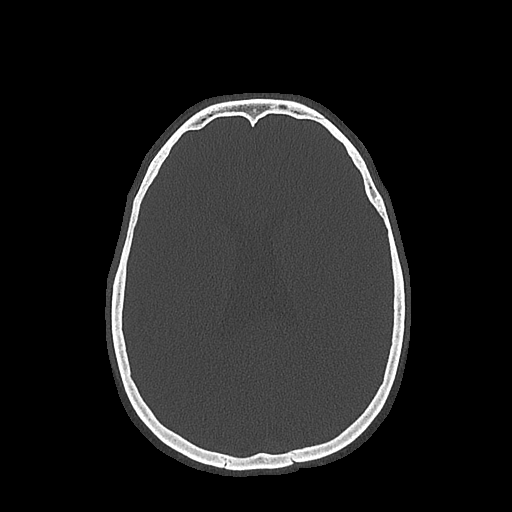
[im 226/291  bone]
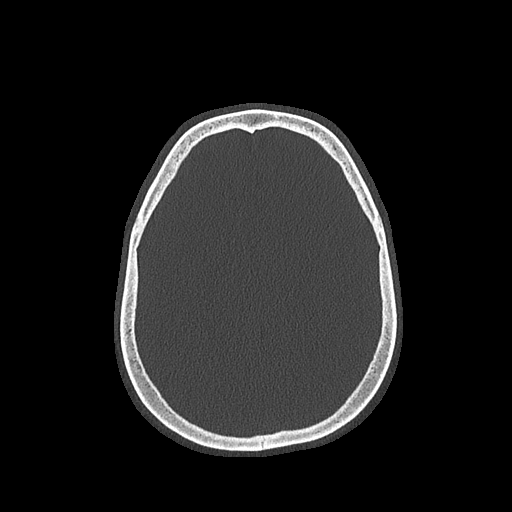
[im 258/291  bone]
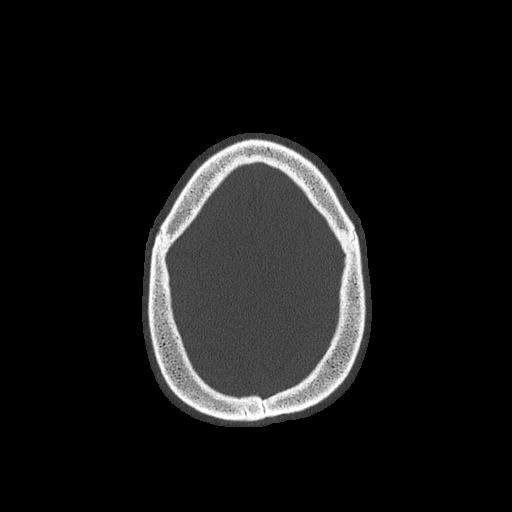

[14 of 47 positions shown; findings below may reference images not displayed]

FINDINGS: Osseous: Slight deformity at the tip of the LEFT nasal bone, see
series 2, image 51. This probably represents an old fracture. No
mandibular dislocation. No other facial deformities. No destructive
process.

Orbits: Negative. No traumatic or inflammatory finding.

Sinuses: The frontal sinuses demonstrate mild mucosal thickening.
The ethmoid sinuses demonstrate BILATERAL mucosal thickening. The
maxillary sinuses demonstrate BILATERAL retention cysts. The
sphenoid sinus demonstrates mild mucosal thickening. There is no
significant ostiomeatal unit narrowing on the RIGHT nor LEFT. There
is moderate nasal septal deviation to the RIGHT, 4 mm.

Soft tissues: Mild asymmetric LEFT-sided nasal soft tissue swelling
does not appear to represent an underlying cyst or result in osseous
remodeling.

Limited intracranial: Negative
IMPRESSION: Slight deformity at the tip of the LEFT nasal bone, probably
representing an old fracture.

Mild asymmetric LEFT-sided nasal soft tissue swelling does not
appear to represent an underlying cyst or result in regional osseous
remodeling.

Chronic sinusitis as described. Nasal septal deviation LEFT to RIGHT
of 4 mm. No significant barriers to sinus drainage.

ADDENDUM:
Dr. Sania stopped by the reading room at [DATE] on 05/03/2019 and
requested a second opinion on this case.

I suggested the diagnosis of nasal bone osteomyelitis given the
widespread mixed lytic and sclerotic changes in the bilateral nasal
bones and nasal septum and the overlying diffuse nasal soft tissue
thickening. These findings were largely new compared to an outside
sinus CT performed on 07/07/2018, which was provided on a CD for
review on this curbside consult.

The patient's findings/symptoms have apparently not improved on
antibiotic therapy. The findings could be due to a smoldering nasal
infection such as tuberculosis or candida. Biopsy may be required to
establish the diagnosis. A chest radiograph could be considered to
evaluate for active pulmonary tuberculosis.

These results were discussed in person with Dr. Sania by Dr. Ke Ai
at the time of interpretation on 05/03/2019 at [DATE], who verbally
acknowledged these results.

*** End of Addendum ***
FINDINGS: Osseous: Slight deformity at the tip of the LEFT nasal bone, see
series 2, image 51. This probably represents an old fracture. No
mandibular dislocation. No other facial deformities. No destructive
process.

Orbits: Negative. No traumatic or inflammatory finding.

Sinuses: The frontal sinuses demonstrate mild mucosal thickening.
The ethmoid sinuses demonstrate BILATERAL mucosal thickening. The
maxillary sinuses demonstrate BILATERAL retention cysts. The
sphenoid sinus demonstrates mild mucosal thickening. There is no
significant ostiomeatal unit narrowing on the RIGHT nor LEFT. There
is moderate nasal septal deviation to the RIGHT, 4 mm.

Soft tissues: Mild asymmetric LEFT-sided nasal soft tissue swelling
does not appear to represent an underlying cyst or result in osseous
remodeling.

Limited intracranial: Negative
IMPRESSION: Slight deformity at the tip of the LEFT nasal bone, probably
representing an old fracture.

Mild asymmetric LEFT-sided nasal soft tissue swelling does not
appear to represent an underlying cyst or result in regional osseous
remodeling.

Chronic sinusitis as described. Nasal septal deviation LEFT to RIGHT
of 4 mm. No significant barriers to sinus drainage.
